# Patient Record
Sex: Female | Born: 1935 | Race: White | Hispanic: No | Marital: Single | Smoking: Never smoker
Health system: Southern US, Community
[De-identification: ages and names within clinical notes are randomized; demographics above are authoritative.]

## PROBLEM LIST (undated history)

## (undated) DIAGNOSIS — M1990 Unspecified osteoarthritis, unspecified site: Secondary | ICD-10-CM

## (undated) DIAGNOSIS — E7800 Pure hypercholesterolemia, unspecified: Secondary | ICD-10-CM

## (undated) HISTORY — PX: TOTAL KNEE ARTHROPLASTY: SHX125

## (undated) HISTORY — PX: TOTAL HIP ARTHROPLASTY: SHX124

---

## 1998-01-24 ENCOUNTER — Other Ambulatory Visit: Admission: RE | Admit: 1998-01-24 | Discharge: 1998-01-24 | Payer: Self-pay | Admitting: Obstetrics and Gynecology

## 2000-03-08 ENCOUNTER — Emergency Department (HOSPITAL_COMMUNITY): Admission: EM | Admit: 2000-03-08 | Discharge: 2000-03-08 | Payer: Self-pay | Admitting: Emergency Medicine

## 2001-03-17 ENCOUNTER — Inpatient Hospital Stay (HOSPITAL_COMMUNITY): Admission: AD | Admit: 2001-03-17 | Discharge: 2001-03-21 | Payer: Self-pay | Admitting: Cardiology

## 2001-03-17 ENCOUNTER — Encounter: Payer: Self-pay | Admitting: Cardiology

## 2001-09-04 ENCOUNTER — Encounter: Admission: RE | Admit: 2001-09-04 | Discharge: 2001-09-04 | Payer: Self-pay | Admitting: Family Medicine

## 2001-09-04 ENCOUNTER — Encounter: Payer: Self-pay | Admitting: Family Medicine

## 2002-04-25 ENCOUNTER — Emergency Department (HOSPITAL_COMMUNITY): Admission: EM | Admit: 2002-04-25 | Discharge: 2002-04-25 | Payer: Self-pay

## 2002-10-12 ENCOUNTER — Encounter: Admission: RE | Admit: 2002-10-12 | Discharge: 2002-10-12 | Payer: Self-pay | Admitting: Family Medicine

## 2002-10-12 ENCOUNTER — Encounter: Payer: Self-pay | Admitting: Family Medicine

## 2003-01-21 ENCOUNTER — Encounter: Payer: Self-pay | Admitting: Family Medicine

## 2003-01-21 ENCOUNTER — Encounter: Admission: RE | Admit: 2003-01-21 | Discharge: 2003-01-21 | Payer: Self-pay | Admitting: Family Medicine

## 2003-02-05 ENCOUNTER — Encounter: Admission: RE | Admit: 2003-02-05 | Discharge: 2003-05-06 | Payer: Self-pay | Admitting: Orthopedic Surgery

## 2003-04-16 ENCOUNTER — Encounter: Payer: Self-pay | Admitting: Family Medicine

## 2003-04-16 ENCOUNTER — Encounter: Admission: RE | Admit: 2003-04-16 | Discharge: 2003-04-16 | Payer: Self-pay | Admitting: Family Medicine

## 2005-06-15 ENCOUNTER — Inpatient Hospital Stay (HOSPITAL_COMMUNITY): Admission: EM | Admit: 2005-06-15 | Discharge: 2005-06-17 | Payer: Self-pay | Admitting: Emergency Medicine

## 2005-06-18 ENCOUNTER — Emergency Department (HOSPITAL_COMMUNITY): Admission: EM | Admit: 2005-06-18 | Discharge: 2005-06-18 | Payer: Self-pay | Admitting: Emergency Medicine

## 2005-07-22 ENCOUNTER — Emergency Department (HOSPITAL_COMMUNITY): Admission: EM | Admit: 2005-07-22 | Discharge: 2005-07-22 | Payer: Self-pay | Admitting: Emergency Medicine

## 2005-09-05 ENCOUNTER — Emergency Department (HOSPITAL_COMMUNITY): Admission: EM | Admit: 2005-09-05 | Discharge: 2005-09-05 | Payer: Self-pay | Admitting: Emergency Medicine

## 2011-08-12 ENCOUNTER — Emergency Department (HOSPITAL_BASED_OUTPATIENT_CLINIC_OR_DEPARTMENT_OTHER): Payer: Medicare Other

## 2011-08-12 ENCOUNTER — Emergency Department (HOSPITAL_BASED_OUTPATIENT_CLINIC_OR_DEPARTMENT_OTHER)
Admission: EM | Admit: 2011-08-12 | Discharge: 2011-08-13 | Disposition: A | Discharge status: Other Acute Inpatient Hospital | Payer: Medicare Other | Source: Home / Self Care | Attending: Emergency Medicine | Admitting: Emergency Medicine

## 2011-08-12 ENCOUNTER — Other Ambulatory Visit: Payer: Self-pay

## 2011-08-12 ENCOUNTER — Encounter: Payer: Self-pay | Admitting: *Deleted

## 2011-08-12 ENCOUNTER — Emergency Department (INDEPENDENT_AMBULATORY_CARE_PROVIDER_SITE_OTHER): Payer: Medicare Other

## 2011-08-12 DIAGNOSIS — I10 Essential (primary) hypertension: Secondary | ICD-10-CM | POA: Insufficient documentation

## 2011-08-12 DIAGNOSIS — W19XXXA Unspecified fall, initial encounter: Secondary | ICD-10-CM

## 2011-08-12 DIAGNOSIS — M25569 Pain in unspecified knee: Secondary | ICD-10-CM

## 2011-08-12 DIAGNOSIS — F430 Acute stress reaction: Secondary | ICD-10-CM | POA: Insufficient documentation

## 2011-08-12 DIAGNOSIS — E7800 Pure hypercholesterolemia, unspecified: Secondary | ICD-10-CM | POA: Insufficient documentation

## 2011-08-12 DIAGNOSIS — E119 Type 2 diabetes mellitus without complications: Secondary | ICD-10-CM | POA: Insufficient documentation

## 2011-08-12 DIAGNOSIS — K5900 Constipation, unspecified: Secondary | ICD-10-CM | POA: Insufficient documentation

## 2011-08-12 DIAGNOSIS — Z8739 Personal history of other diseases of the musculoskeletal system and connective tissue: Secondary | ICD-10-CM | POA: Insufficient documentation

## 2011-08-12 DIAGNOSIS — W1789XA Other fall from one level to another, initial encounter: Secondary | ICD-10-CM | POA: Insufficient documentation

## 2011-08-12 DIAGNOSIS — Y929 Unspecified place or not applicable: Secondary | ICD-10-CM | POA: Insufficient documentation

## 2011-08-12 DIAGNOSIS — M79609 Pain in unspecified limb: Secondary | ICD-10-CM

## 2011-08-12 DIAGNOSIS — Z79899 Other long term (current) drug therapy: Secondary | ICD-10-CM | POA: Insufficient documentation

## 2011-08-12 DIAGNOSIS — N289 Disorder of kidney and ureter, unspecified: Secondary | ICD-10-CM | POA: Insufficient documentation

## 2011-08-12 DIAGNOSIS — S60229A Contusion of unspecified hand, initial encounter: Secondary | ICD-10-CM | POA: Insufficient documentation

## 2011-08-12 DIAGNOSIS — Y92009 Unspecified place in unspecified non-institutional (private) residence as the place of occurrence of the external cause: Secondary | ICD-10-CM | POA: Insufficient documentation

## 2011-08-12 DIAGNOSIS — M25559 Pain in unspecified hip: Secondary | ICD-10-CM

## 2011-08-12 HISTORY — DX: Pure hypercholesterolemia, unspecified: E78.00

## 2011-08-12 HISTORY — DX: Unspecified osteoarthritis, unspecified site: M19.90

## 2011-08-12 LAB — DIFFERENTIAL
Basophils Relative: 0 % (ref 0–1)
Eosinophils Absolute: 0.1 10*3/uL (ref 0.0–0.7)
Lymphocytes Relative: 17 % (ref 12–46)
Lymphs Abs: 1.3 10*3/uL (ref 0.7–4.0)
Monocytes Absolute: 0.6 10*3/uL (ref 0.1–1.0)
Neutro Abs: 5.7 10*3/uL (ref 1.7–7.7)
Neutrophils Relative %: 75 % (ref 43–77)

## 2011-08-12 LAB — BASIC METABOLIC PANEL
BUN: 37 mg/dL — ABNORMAL HIGH (ref 6–23)
CO2: 27 mEq/L (ref 19–32)
GFR calc Af Amer: 48 mL/min — ABNORMAL LOW (ref >90)
GFR calc non Af Amer: 41 mL/min — ABNORMAL LOW (ref >90)
Glucose, Bld: 163 mg/dL — ABNORMAL HIGH (ref 70–99)
Potassium: 5 mEq/L (ref 3.5–5.1)

## 2011-08-12 LAB — CBC
MCV: 95.3 fL (ref 78.0–100.0)
Platelets: 126 10*3/uL — ABNORMAL LOW (ref 150–400)
RDW: 13.8 % (ref 11.5–15.5)

## 2011-08-12 MED ORDER — MORPHINE SULFATE 4 MG/ML IJ SOLN
4.0000 mg | Freq: Once | INTRAMUSCULAR | Status: AC
  Administered 2011-08-12: 4 mg via INTRAMUSCULAR
  Filled 2011-08-12: qty 1

## 2011-08-12 MED ORDER — HYDROCODONE-ACETAMINOPHEN 5-500 MG PO TABS: 1.0000 | ORAL_TABLET | Freq: Four times a day (QID) | ORAL | Status: AC | PRN

## 2011-08-12 MED ORDER — MORPHINE SULFATE 4 MG/ML IJ SOLN
4.0000 mg | Freq: Once | INTRAMUSCULAR | Status: AC
  Administered 2011-08-12: 4 mg via INTRAVENOUS
  Filled 2011-08-12: qty 1

## 2011-08-12 NOTE — ED Provider Notes (Signed)
Results for orders placed during the hospital encounter of 08/12/11  CBC      Component Value Range   WBC 7.6  4.0 - 10.5 (K/uL)   RBC 4.28  3.87 - 5.11 (MIL/uL)   Hemoglobin 13.1  12.0 - 15.0 (g/dL)   HCT 21.3  08.6 - 57.8 (%)   MCV 95.3  78.0 - 100.0 (fL)   MCH 30.6  26.0 - 34.0 (pg)   MCHC 32.1  30.0 - 36.0 (g/dL)   RDW 46.9  62.9 - 52.8 (%)   Platelets 126 (*) 150 - 400 (K/uL)  DIFFERENTIAL      Component Value Range   Neutrophils Relative 75  43 - 77 (%)   Neutro Abs 5.7  1.7 - 7.7 (K/uL)   Lymphocytes Relative 17  12 - 46 (%)   Lymphs Abs 1.3  0.7 - 4.0 (K/uL)   Monocytes Relative 7  3 - 12 (%)   Monocytes Absolute 0.6  0.1 - 1.0 (K/uL)   Eosinophils Relative 1  0 - 5 (%)   Eosinophils Absolute 0.1  0.0 - 0.7 (K/uL)   Basophils Relative 0  0 - 1 (%)   Basophils Absolute 0.0  0.0 - 0.1 (K/uL)  BASIC METABOLIC PANEL      Component Value Range   Sodium 141  135 - 145 (mEq/L)   Potassium 5.0  3.5 - 5.1 (mEq/L)   Chloride 107  96 - 112 (mEq/L)   CO2 27  19 - 32 (mEq/L)   Glucose, Bld 163 (*) 70 - 99 (mg/dL)   BUN 37 (*) 6 - 23 (mg/dL)   Creatinine, Ser 4.13 (*) 0.50 - 1.10 (mg/dL)   Calcium 9.5  8.4 - 24.4 (mg/dL)   GFR calc non Af Amer 41 (*) >90 (mL/min)   GFR calc Af Amer 48 (*) >90 (mL/min)   Patient was reevaluated and was unable to ambulate. This occurred even after 2 doses of morphine. Patient and family and I spoke extensively about plan of care. The family preferred the patient be transferred tone even though her primary care doctor normally would admit to [facility] regional. I spoke with the accepting physician at [facility]. We discussed that patient has stated that she would willing we go to nursing facility placement for her inability to ambulate and her left knee pain. No acute fracture or other cause for this other than discomfort was found today. Patient had a mild thrombocytopenia on her labs with platelets of 126. Otherwise there were no remarkable findings.  Patient will be transferred to [facility] for further care. She'll be admitted the triad hospitalist team 1.  Cyndra Numbers, MD 08/12/11 2222

## 2011-08-12 NOTE — ED Notes (Signed)
Pt c/o left sided CP for few minutes, no SOB or diaphoresis noted. EKG ordered.

## 2011-08-12 NOTE — ED Notes (Signed)
Pt unable to bear weight on legs with 2 person assist. Pt unable to ambulate, only able to pivot to move from bed to [facility].

## 2011-08-12 NOTE — ED Notes (Signed)
Patient unable to bear weight on the left leg.  Patient required 2+ assistance to pivot to the [facility].

## 2011-08-12 NOTE — ED Notes (Signed)
Pt says she was going down a ramp and the ramp collapsed and she fell. She c/o pain in left knee and left upper leg. Voices a previous knee replacement in the same knee

## 2011-08-12 NOTE — ED Provider Notes (Signed)
History     CSN: [id number] Arrival date & time: 08/12/2011  5:50 PM  Chief Complaint  Patient presents with  . Fall     HPI  Presents after fall. Patient states she normally he relates a cane. She states that she is having some work done to her brain at home. She was walking up the right with her relative when the entire ramp collapsed. She fell to her left side. She was unable to ambulate after the fall. She denies any head trauma, no loss of consciousness. She denies neck pain, back pain. She complains of pain in her diffuse left leg. She denies any hip pain but she is complaining of distal femur, left knee, left tib-fib pain. She denies numbness tingling weakness of extremities. She has a hematoma of her right hand. States that she takes an aspirin daily. History of left knee arthroplasty.   Past Medical History  Diagnosis Date  . High cholesterol   . Diabetes mellitus   . Arthritis     Past Surgical History  Procedure Date  . Total hip arthroplasty   . Total knee arthroplasty     No family history on file.  History  Substance Use Topics  . Smoking status: Not on file  . Smokeless tobacco: Not on file  . Alcohol Use: No    OB History    Grav Para Term Preterm Abortions TAB SAB Ect Mult Living                  Review of Systems Negative except as noted in history of present illness  Allergies  Penicillins  Home Medications   Current Outpatient Rx  Name Route Sig Dispense Refill  . ASPIRIN 325 MG PO TBEC Oral Take 325 mg by mouth daily.      Marland Kitchen VITAMIN B-12 PO Oral Take 1 tablet by mouth daily.      Marland Kitchen GABAPENTIN 300 MG PO CAPS Oral Take 300 mg by mouth daily.      Marland Kitchen PANTOPRAZOLE SODIUM 40 MG PO TBEC Oral Take 40 mg by mouth daily.      Marland Kitchen SIMVASTATIN 40 MG PO TABS Oral Take 40 mg by mouth daily.      Marland Kitchen SIMVASTATIN PO Oral Take by mouth.      . DIOVAN PO Oral Take by mouth.      . VALSARTAN 320 MG PO TABS Oral Take 320 mg by mouth daily.      Marland Kitchen VITAMIN D  (ERGOCALCIFEROL) PO Oral Take 1 tablet by mouth daily.        BP 146/71  Pulse 91  Temp(Src) 98.9 F (37.2 C) (Oral)  Resp 16  SpO2 96%  Physical Exam  Nursing note and vitals reviewed. Constitutional: She is oriented to person, place, and time. She appears well-developed.  HENT:  Head: Atraumatic.  Mouth/Throat: Oropharynx is clear and moist.  Eyes: Conjunctivae and EOM are normal. Pupils are equal, round, and reactive to light.  Neck: Normal range of motion. Neck supple.       No cervical thoracic or lumbar midline tenderness to palpation  Cardiovascular: Normal rate, regular rhythm, normal heart sounds and intact distal pulses.   Pulmonary/Chest: Effort normal and breath sounds normal. No respiratory distress. She has no wheezes. She has no rales.  Abdominal: Soft. She exhibits no distension. There is no tenderness. There is no rebound and no guarding.  Musculoskeletal: Normal range of motion.       She has diffuse  pain in her left knee. There is some medial ecchymosis and tenderness to palpation. She has distal femur tenderness to palpation. His left proximal tib-fib tenderness to palpation. Her distal pulses are intact. Gross sensation is intact  Right hand with hematoma phalanx of right thumb palmar aspect. There is mild tenderness to palpation. She does not have ecchymosis, deformity or tenderness to palpation of her right wrist.  Pelvis is stable but unable to fully evaluate left hip secondary to pain in her knee.  Neurological: She is alert and oriented to person, place, and time.  Skin: Skin is warm and dry. No rash noted.  Psychiatric: She has a normal mood and affect.    ED Course  Procedures (including critical care time)  Labs Reviewed - No data to display No results found.   No diagnosis found.    MDM  Status post mechanical fall with multiple complaints. We'll x-ray right hand, pelvis, left femur, left hip, left tib-fib. Pain control reassess.  Stefano Gaul, MD   7:43 PM  XR reviewed and negative. Pt requiring 2+ assist with standing 2/2 knee pain.  CT would likely not be helpful given her arthroplasty causing image scattering. Will order morphine. Reassess for possible admit. D/W Pt and family. Signed out to Dr. Alto Denver.  Stefano Gaul, MD       Forbes Cellar, MD 08/12/11 1944

## 2011-08-13 ENCOUNTER — Inpatient Hospital Stay (HOSPITAL_COMMUNITY): Payer: Medicare Other

## 2011-08-13 ENCOUNTER — Observation Stay (HOSPITAL_COMMUNITY)
Admission: AD | Admit: 2011-08-13 | Discharge: 2011-08-17 | Disposition: A | Discharge status: Home / Self Care | Payer: Medicare Other | Source: Other Acute Inpatient Hospital | Attending: Internal Medicine | Admitting: Internal Medicine

## 2011-08-13 LAB — BASIC METABOLIC PANEL
BUN: 31 mg/dL — ABNORMAL HIGH (ref 6–23)
CO2: 26 mEq/L (ref 19–32)
Chloride: 105 mEq/L (ref 96–112)
GFR calc non Af Amer: 44 mL/min — ABNORMAL LOW (ref >90)

## 2011-08-13 LAB — VITAMIN D 25 HYDROXY (VIT D DEFICIENCY, FRACTURES): Vit D, 25-Hydroxy: 36 ng/mL (ref 30–89)

## 2011-08-13 LAB — GLUCOSE, CAPILLARY
Glucose-Capillary: 135 mg/dL — ABNORMAL HIGH (ref 70–99)
Glucose-Capillary: 137 mg/dL — ABNORMAL HIGH (ref 70–99)
Glucose-Capillary: 129 mg/dL — ABNORMAL HIGH (ref 70–99)

## 2011-08-13 NOTE — ED Notes (Signed)
Pt report given to Maralyn Sago, RN with [facility]

## 2011-08-13 NOTE — ED Notes (Signed)
Pt pivoted to [facility] with 2 person assist, voided large amount of clear yellow urine. Pt report given to [facility] and pt placed in there care for transport.

## 2011-08-13 NOTE — ED Notes (Signed)
Pt report given to Inetta Fermo, RN on unit 5000 at [facility]

## 2011-08-14 LAB — GLUCOSE, CAPILLARY
Glucose-Capillary: 177 mg/dL — ABNORMAL HIGH (ref 70–99)
Glucose-Capillary: 129 mg/dL — ABNORMAL HIGH (ref 70–99)

## 2011-08-14 LAB — CBC
Hemoglobin: 11.7 g/dL — ABNORMAL LOW (ref 12.0–15.0)
MCH: 30.7 pg (ref 26.0–34.0)
MCV: 96.9 fL (ref 78.0–100.0)
Platelets: 104 10*3/uL — ABNORMAL LOW (ref 150–400)
RBC: 3.81 MIL/uL — ABNORMAL LOW (ref 3.87–5.11)
WBC: 5.2 10*3/uL (ref 4.0–10.5)

## 2011-08-14 LAB — BASIC METABOLIC PANEL
CO2: 26 mEq/L (ref 19–32)
Calcium: 9.1 mg/dL (ref 8.4–10.5)
Chloride: 107 mEq/L (ref 96–112)
Creatinine, Ser: 1.06 mg/dL (ref 0.50–1.10)
Glucose, Bld: 122 mg/dL — ABNORMAL HIGH (ref 70–99)
Sodium: 143 mEq/L (ref 135–145)

## 2011-08-15 LAB — GLUCOSE, CAPILLARY: Glucose-Capillary: 145 mg/dL — ABNORMAL HIGH (ref 70–99)

## 2011-08-16 DIAGNOSIS — R5381 Other malaise: Secondary | ICD-10-CM

## 2011-08-16 DIAGNOSIS — S8000XA Contusion of unspecified knee, initial encounter: Secondary | ICD-10-CM

## 2011-08-16 LAB — GLUCOSE, CAPILLARY
Glucose-Capillary: 147 mg/dL — ABNORMAL HIGH (ref 70–99)
Glucose-Capillary: 128 mg/dL — ABNORMAL HIGH (ref 70–99)
Glucose-Capillary: 119 mg/dL — ABNORMAL HIGH (ref 70–99)

## 2011-08-17 LAB — GLUCOSE, CAPILLARY: Glucose-Capillary: 114 mg/dL — ABNORMAL HIGH (ref 70–99)

## 2011-08-17 NOTE — Consult Note (Signed)
[name], [name] NO.:  [id number]  MEDICAL RECORD NO.:  [id number]  LOCATION:  5010                         FACILITY:  [facility]  PHYSICIAN:  Eulas Post, MD    DATE OF BIRTH:  [birth year]  DATE OF CONSULTATION:  08/13/2011 DATE OF DISCHARGE:                                CONSULTATION   REQUESTING SERVICE:  [facility] Team.  CHIEF COMPLAINT:  Left knee pain.  HISTORY:  Ms. [name] is an 77 year old woman who had a fall on her porch and landed directly onto her left knee causing her knee go into a deep hyperflexed position, acutely.  She had severe pain and was unable to walk.  She was brought into the emergency room and admitted to the Medical Service.  X-rays were taken.  She complains of severe pain with both active and passive motion around her left knee, and she is unable to bear weight.  This occurred was within the last day or so. She denies any loss of consciousness and had no other injuries in the fall.  The pain is located directly around the left knee, somewhat diffusely.  PAST MEDICAL HISTORY:  She has had a history of DVT as well as morbid obesity, atypical chest pain, osteoarthritis, bilateral total knee replacements, as well as a left-sided total hip replacement, diffuse coronary ectasia, hypertension, hypercholesterolemia.  She also has a history of diabetes.  FAMILY HISTORY:  Positive for DVT in her mother.  SOCIAL HISTORY:  She is a nonsmoker.  REVIEW OF SYSTEMS:  Complete review of systems was performed and was negative with the exception of the complaints in the musculoskeletal system as indicated above.  Please see the associated documentation for additional details of exam findings.  ADDITIONAL EXAMINATION:  Her left knee has a little bit of anterior bruise, but no significant hematoma or bruising.  Sensation is intact throughout the leg.  She has well-healed surgical scar over her left knee.  She has EHL  and FHL are firing.  She can actually lift the leg with assistance off of the bed, and it does not really cause too much in way of pain, but both active and passive motion does cause pain.  I was able to get her to 30 degrees of flexion, but this did cause substantial pain.  It is difficult to assess the integrity of her quadriceps.  There is diffuse pain to palpation directly.  X-rays of her left lower extremity demonstrate evidence for previous total hip arthroplasty, as well as total knee arthroplasty.  All the components appear to be in good position and I do not see evidence for loosening.  We do not have a great x-ray that I can see of the lateral of her knee, however, based on the ones that I do see I do not see evidence for patella alta or patella baja.  IMPRESSION:  Left leg pain, status post fall, status post total knee replacement.  PLAN:  The initial consultation over the phone indicated question meniscal tear.  This is not possible in the setting of total knee replacement.  I do not think that an MRI would be of any benefit to evaluating  her intra-articular components, although if there is concern regarding her integrity of her quadriceps tendon, then an MRI may be considered.  I think that her quadriceps intact clinically, and I am going to get her a knee brace and have her begin working with physical therapy.  She can be weightbearing as tolerated, and she will be on DVT prophylaxis given her history of DVT.  I will defer to the medical service's decision regarding this, but would assume it will probably be Lovenox.  This is an acute severe injury, given the hyperflexed injury this does not surprise me that she is having this much pain and difficulty with ambulation, particularly in light of her morbid obesity. Therefore, physical therapy will certainly be appropriate, and we will plan to make sure that the her extensor mechanism remains intact, and if there is any  additional concern for quadriceps tendon rupture, then MRI may be entertained.  In the meantime, we will see how she does clinically.  I would not recommend any type of injection or any other treatment to the knee with the exception of bracing and therapy.  This is an acute severe injury that is going to take at least a couple of weeks, if not a couple a month or 2 to recover from.  I do not think that she is done any damage to her components, and I do not see any evidence for periprosthetic fracture.     Eulas Post, MD     JPL/MEDQ  D:  08/13/2011  T:  08/13/2011  Job:  [id number]  Electronically Signed by Teryl Lucy MD on 08/17/2011 09:46:44 AM

## 2011-08-24 NOTE — Discharge Summary (Signed)
[name], [name]             ACCOUNT NO.:  [id number]  MEDICAL RECORD NO.:  [id number]  LOCATION:  5010                         FACILITY:  [facility]  PHYSICIAN:  Thad Ranger, MD       DATE OF BIRTH:  [birth year]  DATE OF ADMISSION:  08/13/2011 DATE OF DISCHARGE:  08/17/2011                        DISCHARGE SUMMARY - REFERRING   PRIMARY CARE PHYSICIAN:  Evelena Peat, M.D., [facility].  DISCHARGE DIAGNOSES: 1. Right knee pain, possibly secondary to right knee strain.  No     fracture. 2. Hypertension. 3. Gastroesophageal reflux disease. 4. Mechanical fall.  CONSULTATIONS:  Orthopedics Dr. Teryl Lucy.  DISCHARGE MEDICATIONS: 1. Robaxin 750 mg p.o. q.i.d. 2. Percocet 5/325 mg 1-2 tabs every 6 hours as needed for pain. 3. Senokot-S 2 tablets p.o. daily at bedtime as needed for     constipation. 4. Gabapentin 300 mg p.o. t.i.d. 5. Tylenol Extra Strength 500 mg p.o. t.i.d. for pain. 6. Aspirin 325 mg p.o. daily at bedtime. 7. Protonix 40 mg p.o. daily. 8. Simvastatin 40 mg daily. 9. Vitamin B12 one tablet p.o. daily. 10.Vitamin C one tablet daily. 11.Vitamin D one tablet daily.  BRIEF HISTORY OF PRESENT ILLNESS:  At the time of admission, Ms. [name] is an 77 year old female who presented after mechanical fall while she was walking on the ramp.  She fell onto her left side.  Fall was witnessed and was broken by her son-in-law who was accompanying down the stairs.  The patient had a hematoma on her right hand, but no other injury.  The patient was taken to the [facility] Emergency Room.  She could not ambulate well as she previously was able to, hence the patient was transferred to [facility] for further workup.  RADIOLOGICAL DATA:  Left tibia-fibula x-ray, osteopenia with postop changes, no fracture or acute abnormality.  Left femur x-ray October 11, postop changes, no fracture or dislocation.  Pelvic x-ray, October  11, degenerative and postop changes, no acute abnormality.  Right hand x-ray negative for fracture or acute abnormality, osteopenia, and degenerative changes.  Left knee x-ray 2 view showed left knee arthroplasty without evidence of hardware complication.  HOSPITAL COURSE:  Brief hospitalization course so far, Ms. [name] is an 77 year old female who was admitted after a mechanical fall and pain in her left knee. 1. Mechanical fall with left knee strain.  The patient was admitted to     the hospitalist service.  Orthopedics was consulted, and the     patient was graciously seen by Dr. Teryl Lucy.  The patient had     multiple radiological imaging during the hospitalization which were     negative for fracture or dislocation.  The patient was placed on     knee brace and immobilization and weight bearing as tolerated.  The     patient did not qualify for inpatient or outpatient rehab.  She has     progressed and improved with physical therapy, and will be     discharged home to continue home physical therapy and occupational     therapy.  The patient will follow up with Dr. Teryl Lucy within     next 1  to 2 weeks after the hospital discharge. 2. Pain control, the patient was started on Robaxin, Percocet, Tylenol     Extra Strength, and Neurontin which has somewhat controlled her     pain.  The patient does not have any pain in her knee upon rest,     however, has pain upon ambulating with physical therapy. 3. Hypertension.  During the hospitalization, the patient's blood     pressure was borderline, hence the outpatient Diovan was placed on     hold until she follows up with her primary care physician.  VITAL SIGNS AT THE TIME OF THE DISCHARGE:  VITAL SIGNS:  Temp 98.1, pulse 60, respirations 18, blood pressure 126/78, O2 sats 94% on room air. GENERAL:  The patient is alert, awake, and oriented, not in any acute distress. HEENT:  Anicteric sclerae and clear conjunctivae.   Pupils are reactive to light and accommodation.  EOMI. NECK:  Supple.  No lymphadenopathy.  No JVD. CVS:  S1 and S2 clear. CHEST:  Clear to auscultation bilaterally. ABDOMEN:  Soft, nontender, nondistended.  Normal bowel sounds. EXTREMITIES:  Left knee in immobilizer.  DISCHARGE FOLLOWUP:  Discharge followup with Dr. Teryl Lucy in 1 to 2 weeks of hospital followup.  Discharge followup with Dr. Evelena Peat in 2 weeks.  TIME SPENT:  35 minutes.     Thad Ranger, MD     RR/MEDQ  D:  08/17/2011  T:  08/17/2011  Job:  [id number]  cc:   Eulas Post, MD Evelena Peat, M.D.  Electronically Signed by Andres Labrum Kori Colin  on 08/24/2011 01:37:25 PM

## 2011-08-27 NOTE — H&P (Signed)
[name], [name]             ACCOUNT NO.:  [id number]  MEDICAL RECORD NO.:  [id number]  LOCATION:  5010                         FACILITY:  [facility]  PHYSICIAN:  Pleas Koch, MD        DATE OF BIRTH:  [birth year]  DATE OF ADMISSION:  08/13/2011 DATE OF DISCHARGE:                             HISTORY & PHYSICAL   CHIEF COMPLAINT:  Fall.  This is briefly a very pleasant 77 year old Caucasian female who was on her way to her sister-in-law's funeral said when  she was walking on the right side of a ramp, she fell onto her left side but states that the mechanism of fall was that she fell, the ramp just collapsed and her knee buckled from under her with the hamstrings flexed and she fell on to the point of her knee and hurt the medial side of the left knee.  The fall was witnessed and was broken by her son-in-law who is accompanying down the stairs.  She had a hematoma on her right hand, but has had no other real injury and was taken directly to the [facility] Emergency Room.  There, she was given numerous doses of pain medications but was complete total assist with regards to her pain and in view of the fact that she could not ambulate well as she previously was able to, hospitalist was asked to admit the patient.  PAST MEDICAL HISTORY:  Significant for atypical chest pain, remote history of DVT, severe osteoarthritis, diffuse coronary ectasia, hypertension, hypercholesterolemia, super morbid obesity, DVT off Coumadin.  The patient has had hip surgeries, knee surgeries, and multiple other osteoarthritis type surgeries in the past.  No known drug allergies.  MEDICATIONS:  Have not been reconciled at present time.  SOCIAL HISTORY:  The patient lives alone at home.  Has never been a smoker or drinker.  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  PHYSICAL EXAMINATION:  GENERAL:  The patient is a very pleasant Caucasian female in no  acute distress.  She is morbidly obese. VITAL SIGNS:  On admission, her vitals were T 99.3, pulse 73, respirations were 20, blood pressure 111/66, and O2 saturations 98% on room air. HEENT:  She has no icterus or pallor.  She has poor dentition. NECK:  I do not appreciate any JVD. HEART:  S1 and S2.  No murmurs, rubs, or gallops. LUNGS:  CTAB. ABDOMEN:  Morbidly obese, nontender, and nondistended. EXTREMITIES:  Left lower extremity; there does appear to be some diffuse effusion around the knee joint.  There is some remnant of scarring on the lateral aspect of the knee.  I am not able to do any specific maneuvers other than passively bend her knee and even this caused her severe pain.  She can plantar flex both ankles effectively. NEUROLOGIC:  She seems grossly intact.  PERTINENT LABORATORY TESTS AND WORKUP:  CBC showing WBC 7.6, hemoglobin 13.1, hematocrit 40.8, and platelet count of 126.  BMET was hemolyzed showed a potassium of 5.0, sodium 141, chloride 107, BUN and creatinine 37 and 1.20.  Her baseline creatinine in 2006 was 1.3.  Differential:  Neutrophils of  75%.  RADIOLOGY DONE:  X-ray of tib-fib September 12, 2011, showed osteopenia and postop change, no acute fracture or acute abnormality.  Subsequent femur x-ray of the left showed postop changes with no fracture/dislocation >  Subsequent  pelvis x-ray on September 12, 2011, showed postop. changes as above.  No acute abnormality.  X-ray of hand showed negative fracture, no acute bony abnormality, osteopenia and degenerative changes as above.  IMPRESSION/ASSESSMENT: 1. Fall with possible left knee meniscal tear.  Dr. Salvatore Marvel was     her orthopedist in the past who did her surgery.  We will consult     him.  In the morning, rounding physician will consult orthopedician     with regards to best plan of care for the same.  She may benefit     from an MRI of the knee if this is deemed appropriate by     Orthopedics.   Physical therapy and occupational therapy will be     involved in her care with regards to this and I will order their     assessment and plan. 2. Acute kidney injury.  The patient's baseline creatinine is about     1.2-1.3, but she does have some acute renal insufficiency     superimposed on this and we will place her on saline at 75 mL an     hour. 3. Obesity.  The patient will need Nutrition consult, which we will     order. 4. Osteopenia.  The patient will need to be on calcium carbonate and     we will get a vitamin D level. 5. Hypertension.  This is moderately controlled.  We will review the     patient daily. 6. Pain secondary to fall.  The patient will be kept on morphine. 7. Constipation.  The patient does complain of constipation.  She will     be kept on the correct bowel regimen.  The patient will be reviewed on day-to-day basis and the patient's disposition likely will be a nursing facility if she is not able to ambulate.  I spent over 35 minutes in time including admitting this patient.          ______________________________ Pleas Koch, MD     JS/MEDQ  D:  08/13/2011  T:  08/13/2011  Job:  [id number]  Electronically Signed by Pleas Koch MD on 08/27/2011 02:34:45 PM

## 2011-09-25 ENCOUNTER — Emergency Department (HOSPITAL_BASED_OUTPATIENT_CLINIC_OR_DEPARTMENT_OTHER)
Admission: EM | Admit: 2011-09-25 | Discharge: 2011-09-25 | Disposition: A | Discharge status: Home / Self Care | Payer: Medicare Other | Attending: Emergency Medicine | Admitting: Emergency Medicine

## 2011-09-25 ENCOUNTER — Emergency Department (INDEPENDENT_AMBULATORY_CARE_PROVIDER_SITE_OTHER): Payer: Medicare Other

## 2011-09-25 ENCOUNTER — Encounter (HOSPITAL_BASED_OUTPATIENT_CLINIC_OR_DEPARTMENT_OTHER): Payer: Self-pay | Admitting: *Deleted

## 2011-09-25 DIAGNOSIS — Z79899 Other long term (current) drug therapy: Secondary | ICD-10-CM | POA: Insufficient documentation

## 2011-09-25 DIAGNOSIS — E7800 Pure hypercholesterolemia, unspecified: Secondary | ICD-10-CM | POA: Insufficient documentation

## 2011-09-25 DIAGNOSIS — J209 Acute bronchitis, unspecified: Secondary | ICD-10-CM | POA: Insufficient documentation

## 2011-09-25 DIAGNOSIS — E119 Type 2 diabetes mellitus without complications: Secondary | ICD-10-CM | POA: Insufficient documentation

## 2011-09-25 DIAGNOSIS — Z8739 Personal history of other diseases of the musculoskeletal system and connective tissue: Secondary | ICD-10-CM | POA: Insufficient documentation

## 2011-09-25 DIAGNOSIS — R05 Cough: Secondary | ICD-10-CM

## 2011-09-25 DIAGNOSIS — R062 Wheezing: Secondary | ICD-10-CM

## 2011-09-25 DIAGNOSIS — R0602 Shortness of breath: Secondary | ICD-10-CM | POA: Insufficient documentation

## 2011-09-25 LAB — RAPID STREP SCREEN (MED CTR MEBANE ONLY): Streptococcus, Group A Screen (Direct): NEGATIVE

## 2011-09-25 MED ORDER — ALBUTEROL SULFATE HFA 108 (90 BASE) MCG/ACT IN AERS
2.0000 | INHALATION_SPRAY | RESPIRATORY_TRACT | Status: DC | PRN
  Administered 2011-09-25: 2 via RESPIRATORY_TRACT
  Filled 2011-09-25: qty 6.7

## 2011-09-25 MED ORDER — ALBUTEROL SULFATE HFA 108 (90 BASE) MCG/ACT IN AERS: 2.0000 | INHALATION_SPRAY | RESPIRATORY_TRACT | Status: DC | PRN

## 2011-09-25 MED ORDER — AZITHROMYCIN 250 MG PO TABS: 250.0000 mg | ORAL_TABLET | Freq: Every day | ORAL | Status: AC

## 2011-09-25 NOTE — ED Notes (Signed)
Patient states that Thanksgiving she started experiencing sob & sore throat, night chills,  Which has grown worse, productive cough

## 2011-09-25 NOTE — ED Provider Notes (Signed)
History     CSN: [id number] Arrival date & time: 09/25/2011  9:10 AM   First MD Initiated Contact with Patient 09/25/11 0930      Chief Complaint  Patient presents with  . Shortness of Breath    (Consider location/radiation/quality/duration/timing/severity/associated sxs/prior treatment) HPI Comments: The patient is an 77 year old female who presents with 2-3 days of shortness of breath, productive cough, sore throat, nasal congestion, chills and is found to have end exhalatory wheezing on auscultation of her chest. She is in no apparent respiratory distress. She does have a hoarse voice.  Patient is a 77 y.o. female presenting with shortness of breath. The history is provided by the patient and a relative.  Shortness of Breath  The current episode started 2 days ago. The onset was gradual. The problem occurs frequently. The problem has been gradually worsening. The problem is mild. The symptoms are relieved by nothing. The symptoms are aggravated by nothing. Associated symptoms include sore throat, cough, shortness of breath and wheezing. Pertinent negatives include no chest pain, no chest pressure, no orthopnea, no fever, no rhinorrhea and no stridor. The cough has no precipitants. The cough is productive. Color change with cough: Yellow. Nothing relieves the cough. Nothing worsens the cough. She has been experiencing a moderate sore throat. Neither side is more painful than the other. The sore throat is characterized by pain only. There was no intake of a foreign body. She has not inhaled smoke recently. She has had no prior steroid use. Her past medical history does not include asthma, bronchiolitis, past wheezing, eczema or asthma in the family. She has been behaving normally. Urine output has been normal. There were no sick contacts. She has received no recent medical care.    Past Medical History  Diagnosis Date  . High cholesterol   . Diabetes mellitus   . Arthritis     Past  Surgical History  Procedure Date  . Total hip arthroplasty   . Total knee arthroplasty     No family history on file.  History  Substance Use Topics  . Smoking status: Not on file  . Smokeless tobacco: Not on file  . Alcohol Use: No    OB History    Grav Para Term Preterm Abortions TAB SAB Ect Mult Living                  Review of Systems  Constitutional: Positive for fatigue. Negative for fever, chills, diaphoresis, activity change, appetite change and unexpected weight change.  HENT: Positive for congestion, sore throat and postnasal drip. Negative for hearing loss, ear pain, nosebleeds, facial swelling, rhinorrhea, sneezing, drooling, mouth sores, trouble swallowing, neck pain, neck stiffness, dental problem, voice change, sinus pressure, tinnitus and ear discharge.   Eyes: Negative for photophobia, pain, discharge, redness and itching.  Respiratory: Positive for cough, shortness of breath and wheezing. Negative for choking, chest tightness and stridor.   Cardiovascular: Negative for chest pain, palpitations, orthopnea and leg swelling.  Gastrointestinal: Negative for nausea, vomiting, abdominal pain, diarrhea, constipation, blood in stool, abdominal distention and anal bleeding.  Genitourinary: Negative for dysuria, urgency, frequency, hematuria, flank pain and difficulty urinating.  Musculoskeletal: Positive for myalgias. Negative for back pain, joint swelling, arthralgias and gait problem.  Skin: Negative for color change, pallor, rash and wound.  Neurological: Negative for dizziness, weakness, light-headedness and headaches.  Hematological: Positive for adenopathy. Does not bruise/bleed easily.  Psychiatric/Behavioral: Negative.     Allergies  Penicillins  Home Medications  Current Outpatient Rx  Name Route Sig Dispense Refill  . ASPIRIN 325 MG PO TBEC Oral Take 325 mg by mouth daily.      Marland Kitchen VITAMIN B-12 PO Oral Take 1 tablet by mouth daily.      Marland Kitchen GABAPENTIN 300  MG PO CAPS Oral Take 300 mg by mouth daily.      Marland Kitchen PANTOPRAZOLE SODIUM 40 MG PO TBEC Oral Take 40 mg by mouth daily.      Marland Kitchen SIMVASTATIN 40 MG PO TABS Oral Take 40 mg by mouth daily.      Marland Kitchen SIMVASTATIN PO Oral Take by mouth.      . DIOVAN PO Oral Take by mouth.      . VALSARTAN 320 MG PO TABS Oral Take 320 mg by mouth daily.      Marland Kitchen VITAMIN D (ERGOCALCIFEROL) PO Oral Take 1 tablet by mouth daily.        BP 175/79  Pulse 68  Temp(Src) 98.3 F (36.8 C) (Oral)  Resp 21  SpO2 97%  Physical Exam  Nursing note and vitals reviewed. Constitutional: She is oriented to person, place, and time. She appears well-developed and well-nourished. No distress.  HENT:  Head: Normocephalic and atraumatic.  Right Ear: External ear normal.  Left Ear: External ear normal.  Nose: Nose normal.  Mouth/Throat: Oropharynx is clear and moist.  Eyes: Conjunctivae and EOM are normal. Pupils are equal, round, and reactive to light.  Neck: Normal range of motion. Neck supple. No JVD present. No tracheal deviation present.  Cardiovascular: Normal rate, regular rhythm, normal heart sounds and intact distal pulses.  Exam reveals no gallop and no friction rub.   No murmur heard. Pulmonary/Chest: Effort normal. No accessory muscle usage or stridor. Not tachypneic. No respiratory distress. She has no decreased breath sounds. She has wheezes in the right upper field, the right middle field, the left upper field and the left middle field. She has no rhonchi. She has no rales. She exhibits no tenderness.  Abdominal: Soft. Bowel sounds are normal. She exhibits no distension. There is no tenderness. There is no rebound and no guarding.  Musculoskeletal: Normal range of motion. She exhibits no edema and no tenderness.  Lymphadenopathy:    She has no cervical adenopathy.  Neurological: She is alert and oriented to person, place, and time. She has normal reflexes. No cranial nerve deficit. She exhibits normal muscle tone.  Coordination normal.  Skin: Skin is warm and dry. No rash noted. She is not diaphoretic. No erythema. No pallor.  Psychiatric: She has a normal mood and affect. Her behavior is normal. Judgment and thought content normal.    ED Course  Procedures (including critical care time)   Labs Reviewed  POCT RAPID STREP A   No results found.   No diagnosis found.    MDM  With productive cough and wheezing in a patient without reactive airway disease, especially with her being elderly and diabetic, I am interested in seeing a chest x-ray to evaluate for pneumonia. Otherwise I think that this represents an acute bronchitis. Her pharynx is mildly erythematous but not frankly apparent for strep throat. I will send a throat culture to evaluate this further. I will treat her wheezing with an albuterol inhaler. As she is diabetic I will not initially treat her with steroids as she is in no apparent respiratory distress.        Felisa Bonier, MD 09/25/11 [phone]

## 2012-08-10 ENCOUNTER — Other Ambulatory Visit: Payer: Self-pay | Admitting: Family Medicine

## 2012-08-10 DIAGNOSIS — IMO0002 Reserved for concepts with insufficient information to code with codable children: Secondary | ICD-10-CM

## 2012-08-21 ENCOUNTER — Ambulatory Visit
Admission: RE | Admit: 2012-08-21 | Discharge: 2012-08-21 | Disposition: A | Discharge status: Home / Self Care | Payer: Medicare Other | Source: Ambulatory Visit | Attending: Family Medicine | Admitting: Family Medicine

## 2012-08-21 DIAGNOSIS — IMO0002 Reserved for concepts with insufficient information to code with codable children: Secondary | ICD-10-CM

## 2014-02-28 ENCOUNTER — Encounter: Payer: Self-pay | Admitting: Cardiology

## 2014-02-28 ENCOUNTER — Ambulatory Visit (INDEPENDENT_AMBULATORY_CARE_PROVIDER_SITE_OTHER): Payer: Medicare Other | Admitting: Cardiology

## 2014-02-28 ENCOUNTER — Encounter (INDEPENDENT_AMBULATORY_CARE_PROVIDER_SITE_OTHER): Payer: Self-pay

## 2014-02-28 VITALS — BP 128/74 | HR 72 | Ht 69.0 in | Wt 326.0 lb

## 2014-02-28 DIAGNOSIS — E119 Type 2 diabetes mellitus without complications: Secondary | ICD-10-CM

## 2014-02-28 DIAGNOSIS — I10 Essential (primary) hypertension: Secondary | ICD-10-CM

## 2014-02-28 DIAGNOSIS — R0602 Shortness of breath: Secondary | ICD-10-CM

## 2014-02-28 NOTE — Patient Instructions (Signed)
Your physician recommends that you continue on your current medications as directed. Please refer to the Current Medication list given to you today.  Your physician has requested that you have an echocardiogram. Echocardiography is a painless test that uses sound waves to create images of your heart. It provides your doctor with information about the size and shape of your heart and how well your heart's chambers and valves are working. This procedure takes approximately one hour. There are no restrictions for this procedure.  Your physician recommends that you schedule a follow-up as needed. 

## 2014-02-28 NOTE — Progress Notes (Signed)
[address] [place], [place]  [address] Phone: [phone] Fax:  [phone]  Date:  02/28/2014   ID:  [name], DOB December 28, 1926, MRN [id number]  PCP:  No primary provider on file.   History of Present Illness: [name] is a 80 y.o. female here for the evaluation of shortness of breath. Prior office notes reviewed from 02/06/2014 at her primary physician's office and she was evaluated for bilateral lower extremity edema, body aches, fatigue and generalized weakness that was moderate in severity occurring approximately 6 months ago and worsening. She has no history of heart failure. In 2006 she had an echocardiogram that showed moderate LVH with impaired relaxation. DVT was negative at that time. Morbid obesity. Diabetes hemoglobin A1c 7.5. He was encouraged upon to wear compression stockings. An echocardiogram was requested. Hemoglobin is 14.6, platelets 129, hemoglobin A1c 7.5, sodium 138, potassium 5.2, creatinine 1.2, LDL 99, vitamin B12 normal.  He's been feeling shortness of breath at times with activity she has been attributing to old age, weight. She has had knee replacements, hip replacements. Painful for her to walk. She has a walker with chair. She denies any syncope, fevers, cough.   Wt Readings from Last 3 Encounters:  02/28/14 326 lb (147.873 kg)     Past Medical History  Diagnosis Date  . High cholesterol   . Diabetes mellitus   . Arthritis     Past Surgical History  Procedure Laterality Date  . Total hip arthroplasty    . Total knee arthroplasty      Current Outpatient Prescriptions  Medication Sig Dispense Refill  . aspirin 325 MG EC tablet Take 325 mg by mouth daily.        . Cyanocobalamin (VITAMIN B-12 PO) Take 1 tablet by mouth daily.        Marland Kitchen. gabapentin (NEURONTIN) 300 MG capsule Take 300 mg by mouth daily.        . pantoprazole (PROTONIX) 40 MG tablet Take 40 mg by mouth daily.        . simvastatin (ZOCOR) 40 MG  tablet Take 40 mg by mouth daily.        . valsartan (DIOVAN) 320 MG tablet Take 320 mg by mouth daily.        Marland Kitchen. VITAMIN D, ERGOCALCIFEROL, PO Take 1 tablet by mouth daily.        Marland Kitchen. albuterol (PROVENTIL HFA;VENTOLIN HFA) 108 (90 BASE) MCG/ACT inhaler Inhale 2 puffs into the lungs every 4 (four) hours as needed for wheezing or shortness of breath (cough).  1 Inhaler  0   No current facility-administered medications for this visit.    Allergies:    Allergies  Allergen Reactions  . Penicillins Swelling    Social History:  The patient  reports that she does not drink alcohol or use illicit drugs.   No family history on file.  ROS:  Please see the history of present illness.   Denies any fevers, chills, syncope, bleeding, strokelike symptoms, orthopnea.   All other systems reviewed and negative.   PHYSICAL EXAM: VS:  BP 128/74  Pulse 72  Ht 5\' 9"  (1.753 m)  Wt 326 lb (147.873 kg)  BMI 48.12 kg/m2 Well nourished, well developed, in no acute distress HEENT: normal, [place]/AT, EOMI Neck: no JVD, normal carotid upstroke, no bruit Cardiac:  normal S1, S2; RRR; no murmur Lungs:  clear to auscultation bilaterally, no wheezing, rhonchi or rales Abd: soft, nontender, no  hepatomegaly, no bruits Ext: no edema, 2+ distal pulses Skin: warm and dry GU: deferred Neuro: no focal abnormalities noted, AAO x 3  EKG:  Sinus rhythm, first degree AV block, heart rate 72, inferior infarct pattern, poor R wave progression.     Prior medical records, lab work, studies have been reviewed.  ASSESSMENT AND PLAN:  1. Dyspnea-check echocardiogram. It is been 9 years since last evaluation and at that time echocardiogram demonstrated normal EF with diastolic dysfunction. Echocardiogram results may help guide therapy. In general, I would be careful with diuretics but she may benefit from low dose Lasix 20 mg. Continue with current angiotensin receptor blocker.  2. Morbid obesity-continue to encourage weight loss.  This is limiting mobility. 3. Hypertension-currently well controlled. Medications reviewed. 4. Diabetes-per primary physician. Hemoglobin A1c reviewed as above  Signed, Donato SchultzMark Mckenzey Parcell, MD [facility]  02/28/2014 2:51 PM

## 2014-03-19 ENCOUNTER — Ambulatory Visit (HOSPITAL_COMMUNITY): Payer: Medicare Other | Attending: Cardiovascular Disease | Admitting: Radiology

## 2014-03-19 DIAGNOSIS — R0602 Shortness of breath: Secondary | ICD-10-CM

## 2014-03-19 NOTE — Progress Notes (Signed)
Echocardiogram performed.  

## 2014-04-22 ENCOUNTER — Telehealth: Payer: Self-pay | Admitting: Cardiology

## 2014-04-22 NOTE — Telephone Encounter (Signed)
Spoke with pts Case Manager Nurse Renee to inform her that pt was contacted on 5/21 at 9:21 am by Dr Syliva OvermanSkians CMA in regards to her echo.  CMA Kenyatta contacted this pt and told her that echo was reassuring, and had normal EF.  I did not tell pts personal results with Case Manager Renee, but I did reassure her that pt was notified and if the results were critically abnormal, pt would have been contacted immediately, with intervention.  CM Renee verbalized understanding and agrees with respecting pts rights of privacy.

## 2014-04-22 NOTE — Telephone Encounter (Signed)
Follow up  ° ° ° °Returning call back to nurse  °

## 2014-04-22 NOTE — Telephone Encounter (Signed)
LMTCB

## 2014-04-22 NOTE — Telephone Encounter (Signed)
New message ° ° ° ° ° °Want echo results °

## 2014-12-14 ENCOUNTER — Encounter (HOSPITAL_COMMUNITY): Payer: Self-pay | Admitting: Emergency Medicine

## 2014-12-14 ENCOUNTER — Emergency Department (INDEPENDENT_AMBULATORY_CARE_PROVIDER_SITE_OTHER)
Admission: EM | Admit: 2014-12-14 | Discharge: 2014-12-14 | Disposition: A | Discharge status: Home / Self Care | Payer: Medicare Other | Source: Home / Self Care | Attending: Family Medicine | Admitting: Family Medicine

## 2014-12-14 ENCOUNTER — Emergency Department (INDEPENDENT_AMBULATORY_CARE_PROVIDER_SITE_OTHER): Payer: Medicare Other

## 2014-12-14 DIAGNOSIS — J9801 Acute bronchospasm: Secondary | ICD-10-CM | POA: Diagnosis not present

## 2014-12-14 DIAGNOSIS — R059 Cough, unspecified: Secondary | ICD-10-CM

## 2014-12-14 DIAGNOSIS — R0600 Dyspnea, unspecified: Secondary | ICD-10-CM

## 2014-12-14 DIAGNOSIS — R05 Cough: Secondary | ICD-10-CM

## 2014-12-14 DIAGNOSIS — J4540 Moderate persistent asthma, uncomplicated: Secondary | ICD-10-CM

## 2014-12-14 DIAGNOSIS — I517 Cardiomegaly: Secondary | ICD-10-CM

## 2014-12-14 MED ORDER — ALBUTEROL SULFATE (2.5 MG/3ML) 0.083% IN NEBU
INHALATION_SOLUTION | RESPIRATORY_TRACT | Status: AC
  Filled 2014-12-14: qty 3

## 2014-12-14 MED ORDER — ALBUTEROL SULFATE (2.5 MG/3ML) 0.083% IN NEBU
2.5000 mg | INHALATION_SOLUTION | Freq: Once | RESPIRATORY_TRACT | Status: AC
  Administered 2014-12-14: 2.5 mg via RESPIRATORY_TRACT

## 2014-12-14 MED ORDER — PREDNISONE 20 MG PO TABS: ORAL_TABLET | ORAL | Status: AC

## 2014-12-14 MED ORDER — ALBUTEROL SULFATE HFA 108 (90 BASE) MCG/ACT IN AERS: 2.0000 | INHALATION_SPRAY | RESPIRATORY_TRACT | Status: AC | PRN

## 2014-12-14 MED ORDER — IPRATROPIUM-ALBUTEROL 0.5-2.5 (3) MG/3ML IN SOLN
3.0000 mL | Freq: Once | RESPIRATORY_TRACT | Status: AC
  Administered 2014-12-14: 3 mL via RESPIRATORY_TRACT

## 2014-12-14 MED ORDER — IPRATROPIUM-ALBUTEROL 0.5-2.5 (3) MG/3ML IN SOLN
RESPIRATORY_TRACT | Status: AC
  Filled 2014-12-14: qty 3

## 2014-12-14 NOTE — Discharge Instructions (Signed)
Bronchospasm A bronchospasm is when the tubes that carry air in and out of your lungs (airways) spasm or tighten. During a bronchospasm it is hard to breathe. This is because the airways get smaller. A bronchospasm can be triggered by:  Allergies. These may be to animals, pollen, food, or mold.  Infection. This is a common cause of bronchospasm.  Exercise.  Irritants. These include pollution, cigarette smoke, strong odors, aerosol sprays, and paint fumes.  Weather changes.  Stress.  Being emotional. HOME CARE   Always have a plan for getting help. Know when to call your doctor and local emergency services (911 in the [place] Know where you can get emergency care.  Only take medicines as told by your doctor.  If you were prescribed an inhaler or nebulizer machine, ask your doctor how to use it correctly. Always use a spacer with your inhaler if you were given one.  Stay calm during an attack. Try to relax and breathe more slowly.  Control your home environment:  Change your heating and air conditioning filter at least once a month.  Limit your use of fireplaces and wood stoves.  Do not  smoke. Do not  allow smoking in your home.  Avoid perfumes and fragrances.  Get rid of pests (such as roaches and mice) and their droppings.  Throw away plants if you see mold on them.  Keep your house clean and dust free.  Replace carpet with wood, tile, or vinyl flooring. Carpet can trap dander and dust.  Use allergy-proof pillows, mattress covers, and box spring covers.  Wash bed sheets and blankets every week in hot water. Dry them in a dryer.  Use blankets that are made of polyester or cotton.  Wash hands frequently. GET HELP IF:  You have muscle aches.  You have chest pain.  The thick spit you spit or cough up (sputum) changes from clear or white to yellow, green, gray, or bloody.  The thick spit you spit or cough up gets thicker.  There are problems that may be related  to the medicine you are given such as:  A rash.  Itching.  Swelling.  Trouble breathing. GET HELP RIGHT AWAY IF:  You feel you cannot breathe or catch your breath.  You cannot stop coughing.  Your treatment is not helping you breathe better.  You have very bad chest pain. MAKE SURE YOU:   Understand these instructions.  Will watch your condition.  Will get help right away if you are not doing well or get worse. Document Released: 08/15/2009 Document Revised: 10/23/2013 Document Reviewed: 04/10/2013 [facility] Patient Information 2015 [place]. This information is not intended to replace advice given to you by your health care provider. Make sure you discuss any questions you have with your health care provider.  How to Use an Inhaler Using your inhaler correctly is very important. Good technique will make sure that the medicine reaches your lungs.  HOW TO USE AN INHALER:  Take the cap off the inhaler.  If this is the first time using your inhaler, you need to prime it. Shake the inhaler for 5 seconds. Release four puffs into the air, away from your face. Ask your doctor for help if you have questions.  Shake the inhaler for 5 seconds.  Turn the inhaler so the bottle is above the mouthpiece.  Put your pointer finger on top of the bottle. Your thumb holds the bottom of the inhaler.  Open your mouth.  Either hold  the inhaler away from your mouth (the width of 2 fingers) or place your lips tightly around the mouthpiece. Ask your doctor which way to use your inhaler.  Breathe out as much air as possible.  Breathe in and push down on the bottle 1 time to release the medicine. You will feel the medicine go in your mouth and throat.  Continue to take a deep breath in very slowly. Try to fill your lungs.  After you have breathed in completely, hold your breath for 10 seconds. This will help the medicine to settle in your lungs. If you cannot hold your breath for 10  seconds, hold it for as long as you can before you breathe out.  Breathe out slowly, through pursed lips. Whistling is an example of pursed lips.  If your doctor has told you to take more than 1 puff, wait at least 15-30 seconds between puffs. This will help you get the best results from your medicine. Do not use the inhaler more than your doctor tells you to.  Put the cap back on the inhaler.  Follow the directions from your doctor or from the inhaler package about cleaning the inhaler. If you use more than one inhaler, ask your doctor which inhalers to use and what order to use them in. Ask your doctor to help you figure out when you will need to refill your inhaler.  If you use a steroid inhaler, always rinse your mouth with water after your last puff, gargle and spit out the water. Do not swallow the water. GET HELP IF:  The inhaler medicine only partially helps to stop wheezing or shortness of breath.  You are having trouble using your inhaler.  You have some increase in thick spit (phlegm). GET HELP RIGHT AWAY IF:  The inhaler medicine does not help your wheezing or shortness of breath or you have tightness in your chest.  You have dizziness, headaches, or fast heart rate.  You have chills, fever, or night sweats.  You have a large increase of thick spit, or your thick spit is bloody. MAKE SURE YOU:   Understand these instructions.  Will watch your condition.  Will get help right away if you are not doing well or get worse. Document Released: 07/27/2008 Document Revised: 08/08/2013 Document Reviewed: 05/17/2013 [facility] Patient Information 2015 [place], [place]. This information is not intended to replace advice given to you by your health care provider. Make sure you discuss any questions you have with your health care provider.  Left Ventricular Dysfunction Left ventricular dysfunction means that the main pumping chamber of your heart (left ventricle) is not working well.  The heart is a muscle and needs a constant supply of oxygen and blood. If the arteries that deliver the oxygen and blood are blocked, parts of the heart may get weak and not pump well. The heart may also get weak from having to constantly pump against the resistance of high blood pressure or narrow arteries. CAUSES  Damage after a heart attack.  High blood pressure.  Leaking or malfunctioning heart valves.  Toxins such as alcohol.  Metabolic problems such as:  Vitamin deficiencies.  Thyroid problems.  Diabetes. Heartbeats that are irregular or too fast for a long time. SYMPTOMS Symptoms may progress to include:  Fatigue.  Reduced ability to exercise.  Trouble breathing, especially when active.  Shortness of breath at rest.  Trouble breathing when lying down at night.  Being more tired, light-headed, or confused.  Weight gain  with swelling of the legs and feet.  Passing out.  Sudden cardiac death. DIAGNOSIS  Your doctor may find that your heart pumping function is reduced after taking a medical history and doing a physical exam.   Clues from your exam may include:  The veins in your neck are too big.  An irregular heartbeat.  Abnormal heart sounds.  Sounds in your chest from fluid in your lungs.  Swelling in your ankles or feet.  Tender places in your belly plus swelling.  Other studies that may be done include:  Chest X-ray.  ECG (a recording of the electrical activity of your heart).  Echocardiogram (a picture of your heart made by using sound waves).  Holter monitoring (portable longer term checking of your heart rhythm).  Cardiac stress testing to see how well your heart is pumping.  Angiogram to look for any blocked arteries. HOME CARE INSTRUCTIONS  Activity level--- Your caregiver will help you determine what type of exercise program may be helpful. It is important to maintain strength and increase it if possible. Pace your activities and  avoid shortness of breath or chest pain. Plan activities for at least an hour after meals or before eating. This allows your body to handle one activity at a time. Your caregiver can help advise you.  Diet--- Maintain a low-salt and heart-healthy diet, or as directed by your caregiver. Get diet information from your caregiver or a registered dietitian. Keep your salt shaker out of sight and avoid adding salt to your foods. Measure the amounts of fluids you take in per day in cups and record these amounts.  Medications--- You may have been prescribed an ACE inhibitor or a beta-blocker to take for your heart failure. Take these medications as directed. They improve your heart function and your survival. Ask your caregiver if taking statins (drugs to lower cholesterol) would be helpful. Diuretics or water pills may be prescribed to help get rid of fluids. You may also be given a potassium supplement to replace the amount lost from the water pills.  Weight monitoring---Record your hospital or clinic weight. When you get home, compare it to your scale and record your weight. Weigh twice per day at first and record these weights. Try to weigh at the same time every day. It is best to weigh first thing in the morning, in your same clothes, after going to the bathroom and before eating or drinking anything. Place the scale on a hard-surfaced floor. Bring these weights to your caregiver to be reviewed during your appointments.  Blood pressure monitoring should be done twice a day at first, morning and evening, when you are relaxed. Once your blood pressure has stabilized, rechecking once a day and then a few times a week may be enough. You can get a home blood pressure cuff at your drugstore. Record these values and bring them with you for your clinic visits. Notify your caregiver if you become dizzy or lightheaded upon standing up.  Be familiar with your medications--- If you have trouble remembering when you took  them, write down times or set your medications out in advance for the day or the week to avoid problems. If you take your medications twice a day, place them by your toothbrush and get in the habit of brushing your teeth twice a day. If you are on a water pill (diuretic), take these in the morning so you are not up all night going to the bathroom.  If you are currently  a smoker, it is time to quit. Nicotine makes your heart work harder and is one of the leading causes of cardiac (heart) deaths. Do not leave your doctor's office without a smoking cessation plan or instructions on help available to quit smoking.  Immunization with influenza and pneumococcal vaccines may reduce the risk of respiratory infection.  Nonsteroidal anti-inflammatory drugs should not be used. They can cause salt (sodium) retention and also may impair the action of diuretics and ACE inhibitors. They can also elevate blood pressure. This includes drugs such as Advil, Motrin, ibuprofen, etc.  Aldosterone antagonists such as spironolactone may have helpful effects and may be used if there are symptoms at rest despite the use of diuretics, an ACE inhibitor, and (usually) a beta-blocker. Aldosterone antagonism is recommended in advanced heart failure in addition to ACE inhibition and diuretics to improve survival and decrease chances of other problems.  If you do not follow your diet and take your medications properly, this may lead to a rapid decline in your health, emergency care, or hospitalization. Follow the advice of your caregiver.  What to do if symptoms worsen--- If there are immediate problems go to the Emergency Department. This would include any symptoms which brought you in and which are getting worse rather than better. Call your local emergency services (911 in [place] for immediate care. DAILY PATH TO QUALITY LIVING  Monitor weight and record.  Monitor blood pressure and record.  Monitor fluid intake.  Monitor  sodium intake.  Monitor activity levels.  Take your medications.  Stop all use of nicotine.  Know when to call for help and do so. SEEK IMMEDIATE MEDICAL CARE IF:   Your weight increases by 03 lb/1.4 kg in 1 day or 05 lb/2.3 kg in a week, or as your caregiver suggests.  You notice increasing shortness of breath during rest, sleeping, or with activity, and which is unusual for you.  You develop an increase in angina, or develop chest pain which is unusual for you.  You develop sweating or nausea, which is unusual for you.  You notice swelling in your hands, feet, ankles, or abdomen.  You have a feeling of fullness in your abdomen or develop nausea or loss of appetite.  You notice lasting (persistent) dizziness, blurred vision, headache, or unsteadiness. Make an appointment with your caregiver as directed for follow-up. Document Released: 01/05/2005 Document Revised: 03/04/2014 Document Reviewed: 02/15/2008 [facility] Patient Information 2015 [place], [place]. This information is not intended to replace advice given to you by your health care provider. Make sure you discuss any questions you have with your health care provider.

## 2014-12-14 NOTE — ED Notes (Signed)
C/o  Productive cough.  Body ache.  Sob.   No fever, n/v/d.  No relief with otc meds.    Last week treated with zithromax and prednisone.

## 2014-12-14 NOTE — ED Provider Notes (Signed)
CSN: [id number]     Arrival date & time 12/14/14  1523 History   First MD Initiated Contact with Patient 12/14/14 1621     Chief Complaint  Patient presents with  . Cough   (Consider location/radiation/quality/duration/timing/severity/associated sxs/prior Treatment) HPI Comments: 81 year old morbidly obese female presents with the complaint of legs and arms hurting today and a cough and cold for 2 weeks. One week ago she saw her PCPs assistant and was treated with prednisone and Z-Pak and Tussionex. She states that although the Tussionex helped some her symptoms persist. She also complains of a copious amount of PND and nasal drainage. She is a nonsmoker and has never smoked. Denies any history of cardiopulmonary disease. It is noted on the chart that the patient stated she was on home oxygen as needed for suggesting there is a pulmonary condition requiring supplemental O2.   Past Medical History  Diagnosis Date  . High cholesterol   . Diabetes mellitus   . Arthritis    Past Surgical History  Procedure Laterality Date  . Total hip arthroplasty    . Total knee arthroplasty     History reviewed. No pertinent family history. History  Substance Use Topics  . Smoking status: Never Smoker   . Smokeless tobacco: Not on file  . Alcohol Use: No   OB History    No data available     Review of Systems  Constitutional: Negative for fever, chills, activity change, appetite change and fatigue.  HENT: Positive for congestion, postnasal drip and rhinorrhea. Negative for facial swelling and sore throat.   Eyes: Negative.   Respiratory: Positive for cough. Negative for shortness of breath.   Cardiovascular: Negative.   Musculoskeletal: Negative for neck pain and neck stiffness.  Neurological: Negative.     Allergies  Penicillins  Home Medications   Prior to Admission medications   Medication Sig Start Date End Date Taking? Authorizing Provider  Ascorbic Acid (VITAMIN C) 1000 MG tablet  Take 1,000 mg by mouth daily.   Yes Historical Provider, MD  aspirin 325 MG EC tablet Take 325 mg by mouth daily.     Yes Historical Provider, MD  Cyanocobalamin (VITAMIN B-12 PO) Take 1 tablet by mouth daily.     Yes Historical Provider, MD  pantoprazole (PROTONIX) 40 MG tablet Take 40 mg by mouth daily.     Yes Historical Provider, MD  simvastatin (ZOCOR) 40 MG tablet Take 40 mg by mouth daily.     Yes Historical Provider, MD  VITAMIN D, ERGOCALCIFEROL, PO Take 1 tablet by mouth daily.     Yes Historical Provider, MD  albuterol (PROVENTIL HFA;VENTOLIN HFA) 108 (90 BASE) MCG/ACT inhaler Inhale 2 puffs into the lungs every 4 (four) hours as needed for wheezing or shortness of breath. 12/14/14   Hayden Rasmussen, NP  gabapentin (NEURONTIN) 300 MG capsule Take 300 mg by mouth daily.      Historical Provider, MD  predniSONE (DELTASONE) 20 MG tablet Take 3 tabs po on first day, 2 tabs second day, 2 tabs third day, 1 tab fourth day, 1 tab 5th day. Take with food. 12/14/14   Hayden Rasmussen, NP  valsartan (DIOVAN) 320 MG tablet Take 320 mg by mouth daily.      Historical Provider, MD   BP 158/94 mmHg  Pulse 78  Temp(Src) 98 F (36.7 C) (Oral)  Resp 20  SpO2 94% Physical Exam  Constitutional: She is oriented to person, place, and time. She appears well-developed. No distress.  Obese  HENT:  Mouth/Throat: No oropharyngeal exudate.  Bilateral TMs are normal Oropharynx with erythema, cobblestoning and clear PND.  Eyes: Conjunctivae and EOM are normal.  Neck: Normal range of motion. Neck supple.  Cardiovascular: Normal rate, regular rhythm, normal heart sounds and intact distal pulses.   Pulmonary/Chest:  Few inspiratory wheezes. Expiration with diffuse bilateral coarseness, rhonchi and some wheeze.  Musculoskeletal: She exhibits edema.  Lymphadenopathy:    She has no cervical adenopathy.  Neurological: She is alert and oriented to person, place, and time.  Skin: Skin is warm and dry. There is pallor.   Psychiatric: She has a normal mood and affect.  Vitals reviewed.   ED Course  Procedures (including critical care time) Labs Review Labs Reviewed - No data to display  Imaging Review Dg Chest 2 View  12/14/2014   CLINICAL DATA:  81 year old female with cough for several weeks. Shortness of breath and wheezing. Initial encounter.  EXAM: CHEST  2 VIEW  COMPARISON:  05/25/2011 and earlier.  FINDINGS: Progressed cardiomegaly. Stable lung volumes and chronic coarse/interstitial opacity. No pneumothorax, pulmonary edema, pleural effusion or confluent pulmonary opacity. Calcified atherosclerosis of the aorta. Osteopenia.  IMPRESSION: Chronic lung disease and progressed cardiomegaly since 2012. No superimposed acute findings are identified.   Electronically Signed   By: Odessa FlemingH  Hall M.D.   On: 12/14/2014 16:56     MDM   1. RAD (reactive airway disease) with wheezing, moderate persistent, uncomplicated   2. Dyspnea   3. Bronchospasm   4. Cough   5. Cardiomegaly    Post DuoNeb patient states she is breathing better. There is much improvement and air movement and diminished adventitious sounds. She will be treated with albuterol HFA and low-dose tapering prednisone. She is aware this may increase her blood sugar temporarily. Follow-up with your PCP next week. For drainage Allegra or Claritin or Zyrtec. For stronger and sedating antihistamine Chlor-Trimeton 2 mg every 6 to 8 hours for drainage. Use this as last resort. Lots of fluids.    Hayden Rasmussenavid Taavi Hoose, NP 12/14/14 1721

## 2015-04-20 ENCOUNTER — Encounter (HOSPITAL_COMMUNITY): Payer: Self-pay | Admitting: Emergency Medicine

## 2015-04-20 ENCOUNTER — Emergency Department (INDEPENDENT_AMBULATORY_CARE_PROVIDER_SITE_OTHER)
Admission: EM | Admit: 2015-04-20 | Discharge: 2015-04-20 | Disposition: A | Discharge status: Home / Self Care | Payer: Medicare Other | Source: Home / Self Care | Attending: Family Medicine | Admitting: Family Medicine

## 2015-04-20 DIAGNOSIS — H9201 Otalgia, right ear: Secondary | ICD-10-CM | POA: Diagnosis not present

## 2015-04-20 DIAGNOSIS — H1013 Acute atopic conjunctivitis, bilateral: Secondary | ICD-10-CM

## 2015-04-20 DIAGNOSIS — R682 Dry mouth, unspecified: Secondary | ICD-10-CM | POA: Diagnosis not present

## 2015-04-20 MED ORDER — NYSTATIN 100000 UNIT/ML MT SUSP: 500000.0000 [IU] | Freq: Four times a day (QID) | OROMUCOSAL | Status: AC

## 2015-04-20 NOTE — ED Notes (Signed)
Reports "I hurt all over".   "My mouth is extremely dry".   Constipated.    Denies fever, n/v/d.   Symptoms present x 2 days.

## 2015-04-20 NOTE — ED Provider Notes (Signed)
[name] is a 81 y.o. female who presents to Urgent Care today for dry mouth eye irritation and ear pain.  1) dry mouth. Patient has chronic dry mouth and has had multiple treatments at her primary care provider's office. She cannot recall the name of any of the treatments. She has a dry mouth is persistent and quite bothersome. She states a gritty feeling in her mouth. She denies any significant pain fevers or chills.  2) eye irritation: Patient notes itchy scratchy watery eyes. This is been ongoing for several days. She's used some over-the-counter drops which helped a little. She cannot recall the name of the drop. No fevers or chills.  3) right-sided ear pain present for a few days. Mild to moderate. No treatments tried yet.  4) resolved body aches. Patient had body aches initially today which have since resolved.   Past Medical History  Diagnosis Date  . High cholesterol   . Diabetes mellitus   . Arthritis    Past Surgical History  Procedure Laterality Date  . Total hip arthroplasty    . Total knee arthroplasty     History  Substance Use Topics  . Smoking status: Never Smoker   . Smokeless tobacco: Not on file  . Alcohol Use: No   ROS as above Medications: No current facility-administered medications for this encounter.   Current Outpatient Prescriptions  Medication Sig Dispense Refill  . gabapentin (NEURONTIN) 300 MG capsule Take 300 mg by mouth daily.      . pantoprazole (PROTONIX) 40 MG tablet Take 40 mg by mouth daily.      . simvastatin (ZOCOR) 40 MG tablet Take 40 mg by mouth daily.      Marland Kitchen albuterol (PROVENTIL HFA;VENTOLIN HFA) 108 (90 BASE) MCG/ACT inhaler Inhale 2 puffs into the lungs every 4 (four) hours as needed for wheezing or shortness of breath. 1 Inhaler 0  . Ascorbic Acid (VITAMIN C) 1000 MG tablet Take 1,000 mg by mouth daily.    Marland Kitchen aspirin 325 MG EC tablet Take 325 mg by mouth daily.      . Cyanocobalamin (VITAMIN B-12 PO) Take 1 tablet by mouth  daily.      Marland Kitchen nystatin (MYCOSTATIN) 100000 UNIT/ML suspension Take 5 mLs (500,000 Units total) by mouth 4 (four) times daily. 60 mL 1  . predniSONE (DELTASONE) 20 MG tablet Take 3 tabs po on first day, 2 tabs second day, 2 tabs third day, 1 tab fourth day, 1 tab 5th day. Take with food. 9 tablet 0  . valsartan (DIOVAN) 320 MG tablet Take 320 mg by mouth daily.      Marland Kitchen VITAMIN D, ERGOCALCIFEROL, PO Take 1 tablet by mouth daily.       Allergies  Allergen Reactions  . Penicillins Swelling     Exam:  BP 157/97 mmHg  Pulse 69  Temp(Src) 98.2 F (36.8 C) (Oral)  Resp 18  SpO2 96% Gen: Well NAD morbidly obese elderly woman HEENT: EOMI,  MMM normal tympanic membranes bilaterally. Mild watery discharge from the eyes bilaterally. Mouth is generally normal-appearing with dentures. Lungs: Normal work of breathing. CTABL Heart: RRR no MRG Abd: NABS, Soft. Nondistended, Nontender Exts: Brisk capillary refill, warm and well perfused.   No results found for this or any previous visit (from the past 24 hour(s)). No results found.  Assessment and Plan: 81 y.o. female with  1) dry mouth: Chronic issue with unclear etiology. Trial of nystatin swish and swallow solution 2) eye irritation: Allergic  conjunctivitis versus dry eyes. Zaditor and Systane artificial tears.  3) otalgia. Unclear etiology. Recommend Tylenol  Follow-up with PCP  Discussed warning signs or symptoms. Please see discharge instructions. Patient expresses understanding.     Rodolph Bong, MD 04/20/15 [phone]

## 2015-04-20 NOTE — Discharge Instructions (Signed)
Thank you for coming in today. Use over-the-counter Zaditor eyedrops (Ketotifen) Use over-the-counter Zyrtec (cetirizine)  Use Systane artificial tears as needed  Use the nystatin swish and swallow for your mouth.  Take tylenol.  Return as needed.   Thrush, Adult  Ginette Pitman, also called oral candidiasis, is a fungal infection that develops in the mouth and throat and on the tongue. It causes white patches to form on the mouth and tongue. Ginette Pitman is most common in older adults, but it can occur at any age.  Many cases of thrush are mild, but this infection can also be more serious. Ginette Pitman can be a recurring problem for people who have chronic illnesses or who take medicines that limit the body's ability to fight infection. Because these people have difficulty fighting infections, the fungus that causes thrush can spread throughout the body. This can cause life-threatening blood or organ infections. CAUSES  Ginette Pitman is usually caused by a yeast called Candida albicans. This fungus is normally present in small amounts in the mouth and on other mucous membranes. It usually causes no harm. However, when conditions are present that allow the fungus to grow uncontrolled, it invades surrounding tissues and becomes an infection. Less often, other Candida species can also lead to thrush.  RISK FACTORS Ginette Pitman is more likely to develop in the following people:  People with an impaired ability to fight infection (weakened immune system).   Older adults.   People with HIV.   People with diabetes.   People with dry mouth (xerostomia).   Pregnant women.   People with poor dental care, especially those who have false teeth.   People who use antibiotic medicines.  SIGNS AND SYMPTOMS  Ginette Pitman can be a mild infection that causes no symptoms. If symptoms develop, they may include:   A burning feeling in the mouth and throat. This can occur at the start of a thrush infection.   White patches that  adhere to the mouth and tongue. The tissue around the patches may be red, raw, and painful. If rubbed (during tooth brushing, for example), the patches and the tissue of the mouth may bleed easily.   A bad taste in the mouth or difficulty tasting foods.   Cottony feeling in the mouth.   Pain during eating and swallowing. DIAGNOSIS  Your health care provider can usually diagnose thrush by looking in your mouth and asking you questions about your health.  TREATMENT  Medicines that help prevent the growth of fungi (antifungals) are the standard treatment for thrush. These medicines are either applied directly to the affected area (topical) or swallowed (oral). The treatment will depend on the severity of the condition.  Mild Thrush Mild cases of thrush may clear up with the use of an antifungal mouth rinse or lozenges. Treatment usually lasts about 14 days.  Moderate to Severe Thrush  More severe thrush infections that have spread to the esophagus are treated with an oral antifungal medicine. A topical antifungal medicine may also be used.   For some severe infections, a treatment period longer than 14 days may be needed.   Oral antifungal medicines are almost never used during pregnancy because the fetus may be harmed. However, if a pregnant woman has a rare, severe thrush infection that has spread to her blood, oral antifungal medicines may be used. In this case, the risk of harm to the mother and fetus from the severe thrush infection may be greater than the risk posed by the use of antifungal  medicines.  Persistent or Recurrent Thrush For cases of thrush that do not go away or keep coming back, treatment may involve the following:   Treatment may be needed twice as long as the symptoms last.   Treatment will include both oral and topical antifungal medicines.   People with weakened immune systems can take an antifungal medicine on a continuous basis to prevent thrush infections.   It is important to treat conditions that make you more likely to get thrush, such as diabetes or HIV.  HOME CARE INSTRUCTIONS   Only take over-the-counter or prescription medicine as directed by your health care provider. Talk to your health care provider about an over-the-counter medicine called gentian violet, which kills bacteria and fungi.   Eat plain, unflavored yogurt as directed by your health care provider. Check the label to make sure the yogurt contains live cultures. This yogurt can help healthy bacteria grow in the mouth that can stop the growth of the fungus that causes thrush.   Try these measures to help reduce the discomfort of thrush:   Drink cold liquids such as water or iced tea.   Try flavored ice treats or frozen juices.   Eat foods that are easy to swallow, such as gelatin, ice cream, or custard.   If the patches in your mouth are painful, try drinking from a straw.   Rinse your mouth several times a day with a warm saltwater rinse. You can make the saltwater mixture with 1 tsp (6 g) of salt in 8 fl oz (0.2 L) of warm water.   If you wear dentures, remove the dentures before going to bed, brush them vigorously, and soak them in a cleaning solution as directed by your health care provider.   Women who are breastfeeding should clean their nipples with an antifungal medicine as directed by their health care provider. Dry the nipples after breastfeeding. Applying lanolin-containing body lotion may help relieve nipple soreness.  SEEK MEDICAL CARE IF:  Your symptoms are getting worse or are not improving within 7 days of starting treatment.   You have symptoms of spreading infection, such as white patches on the skin outside of the mouth.   You are nursing and you have redness, burning, or pain in the nipples that is not relieved with treatment.  MAKE SURE YOU:  Understand these instructions.  Will watch your condition.  Will get help right away if  you are not doing well or get worse. Document Released: 07/13/2004 Document Revised: 08/08/2013 Document Reviewed: 05/21/2013 [facility] Patient Information 2015 [place], [place]. This information is not intended to replace advice given to you by your health care provider. Make sure you discuss any questions you have with your health care provider.    Allergic Conjunctivitis The conjunctiva is a thin membrane that covers the visible white part of the eyeball and the underside of the eyelids. This membrane protects and lubricates the eye. The membrane has small blood vessels running through it that can normally be seen. When the conjunctiva becomes inflamed, the condition is called conjunctivitis. In response to the inflammation, the conjunctival blood vessels become swollen. The swelling results in redness in the normally white part of the eye. The blood vessels of this membrane also react when a person has allergies and is then called allergic conjunctivitis. This condition usually lasts for as long as the allergy persists. Allergic conjunctivitis cannot be passed to another person (non-contagious). The likelihood of bacterial infection is great and the cause is  not likely due to allergies if the inflamed eye has:  A sticky discharge.  Discharge or sticking together of the lids in the morning.  Scaling or flaking of the eyelids where the eyelashes come out.  Red swollen eyelids. CAUSES   Viruses.  Irritants such as foreign bodies.  Chemicals.  General allergic reactions.  Inflammation or serious diseases in the inside or the outside of the eye or the orbit (the boney cavity in which the eye sits) can cause a "red eye." SYMPTOMS   Eye redness.  Tearing.  Itchy eyes.  Burning feeling in the eyes.  Clear drainage from the eye.  Allergic reaction due to pollens or ragweed sensitivity. Seasonal allergic conjunctivitis is frequent in the spring when pollens are in the air and in the  fall. DIAGNOSIS  This condition, in its many forms, is usually diagnosed based on the history and an ophthalmological exam. It usually involves both eyes. If your eyes react at the same time every year, allergies may be the cause. While most "red eyes" are due to allergy or an infection, the role of an eye (ophthalmological) exam is important. The exam can rule out serious diseases of the eye or orbit. TREATMENT   Non-antibiotic eye drops, ointments, or medications by mouth may be prescribed if the ophthalmologist is sure the conjunctivitis is due to allergies alone.  Over-the-counter drops and ointments for allergic symptoms should be used only after other causes of conjunctivitis have been ruled out, or as your caregiver suggests. Medications by mouth are often prescribed if other allergy-related symptoms are present. If the ophthalmologist is sure that the conjunctivitis is due to allergies alone, treatment is normally limited to drops or ointments to reduce itching and burning. HOME CARE INSTRUCTIONS   Wash hands before and after applying drops or ointments, or touching the inflamed eye(s) or eyelids.  Do not let the eye dropper tip or ointment tube touch the eyelid when putting medicine in your eye.  Stop using your soft contact lenses and throw them away. Use a new pair of lenses when recovery is complete. You should run through sterilizing cycles at least three times before use after complete recovery if the old soft contact lenses are to be used. Hard contact lenses should be stopped. They need to be thoroughly sterilized before use after recovery.  Itching and burning eyes due to allergies is often relieved by using a cool cloth applied to closed eye(s). SEEK MEDICAL CARE IF:   Your problems do not go away after two or three days of treatment.  Your lids are sticky (especially in the morning when you wake up) or stick together.  Discharge develops. Antibiotics may be needed either as  drops, ointment, or by mouth.  You have extreme light sensitivity.  An oral temperature above 102 F (38.9 C) develops.  Pain in or around the eye or any other visual symptom develops. MAKE SURE YOU:   Understand these instructions.  Will watch your condition.  Will get help right away if you are not doing well or get worse. Document Released: 01/08/2003 Document Revised: 01/10/2012 Document Reviewed: 12/04/2007 [facility] Patient Information 2015 [place], [place]. This information is not intended to replace advice given to you by your health care provider. Make sure you discuss any questions you have with your health care provider.

## 2015-08-19 IMAGING — DX DG CHEST 2V
3 series · 3 of 3 positions shown · non-contrast
Comparison: 05/25/2011 and earlier.

CLINICAL DATA: 87-year-old female with cough for several weeks.
Shortness of breath and wheezing. Initial encounter.

EXAM:
CHEST  2 VIEW

[chest ap]
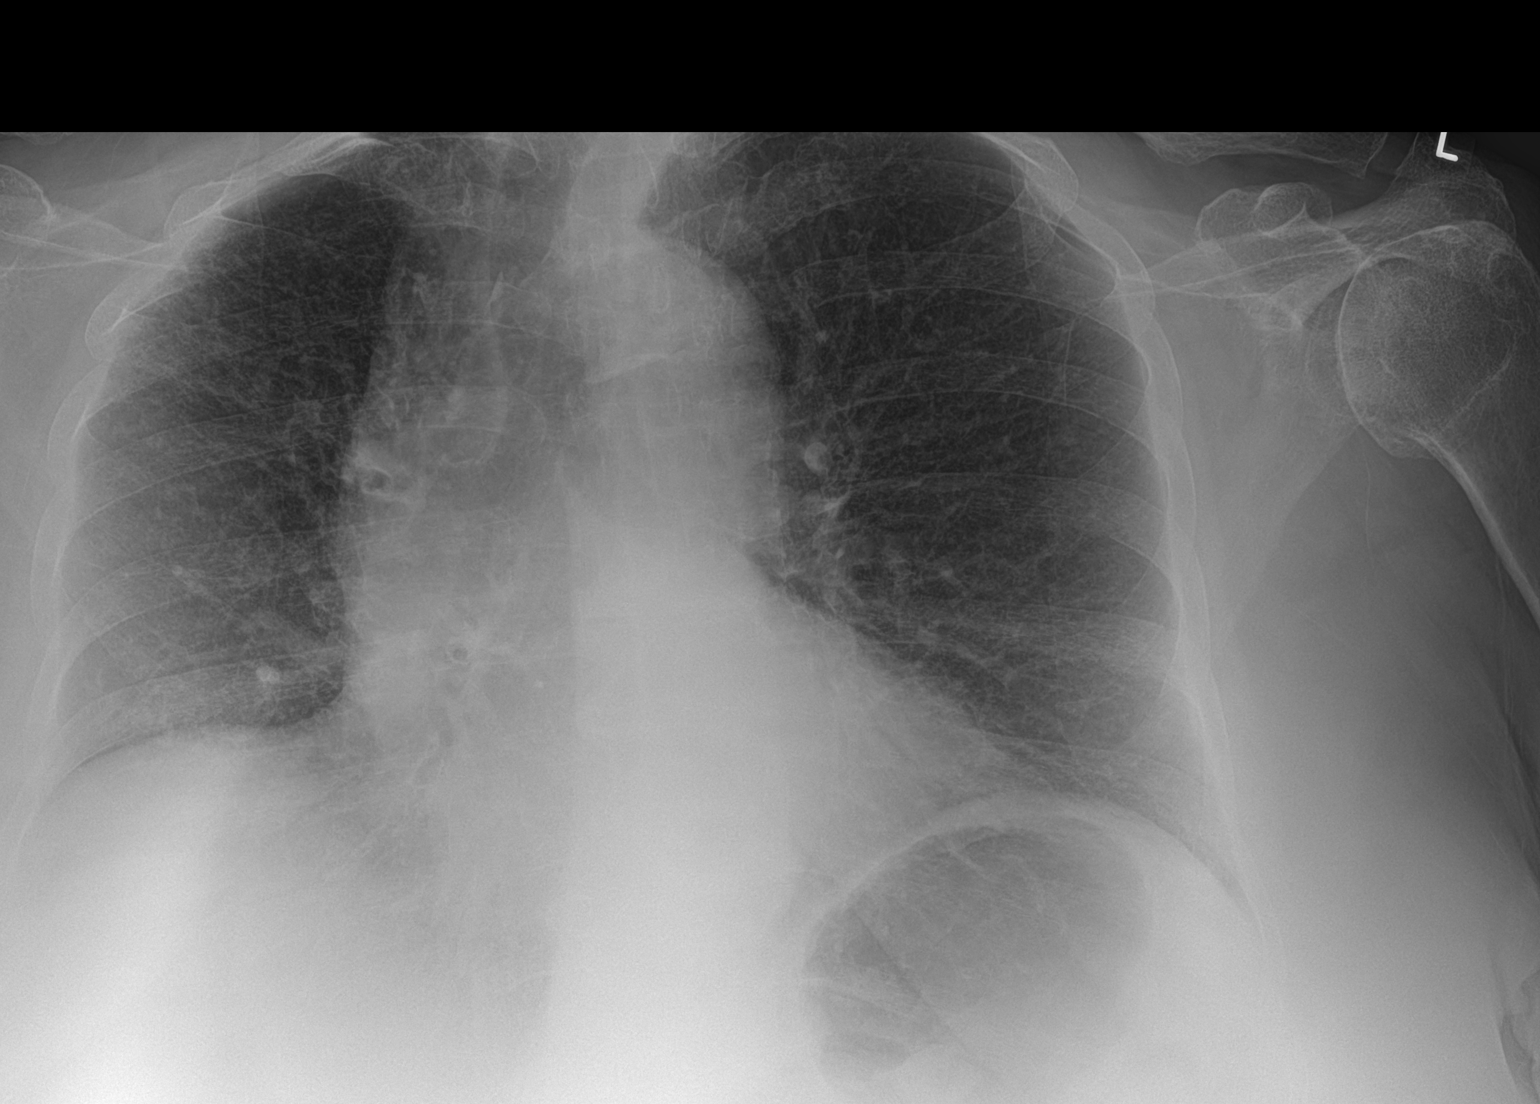

[chest lat (1 of 2)]
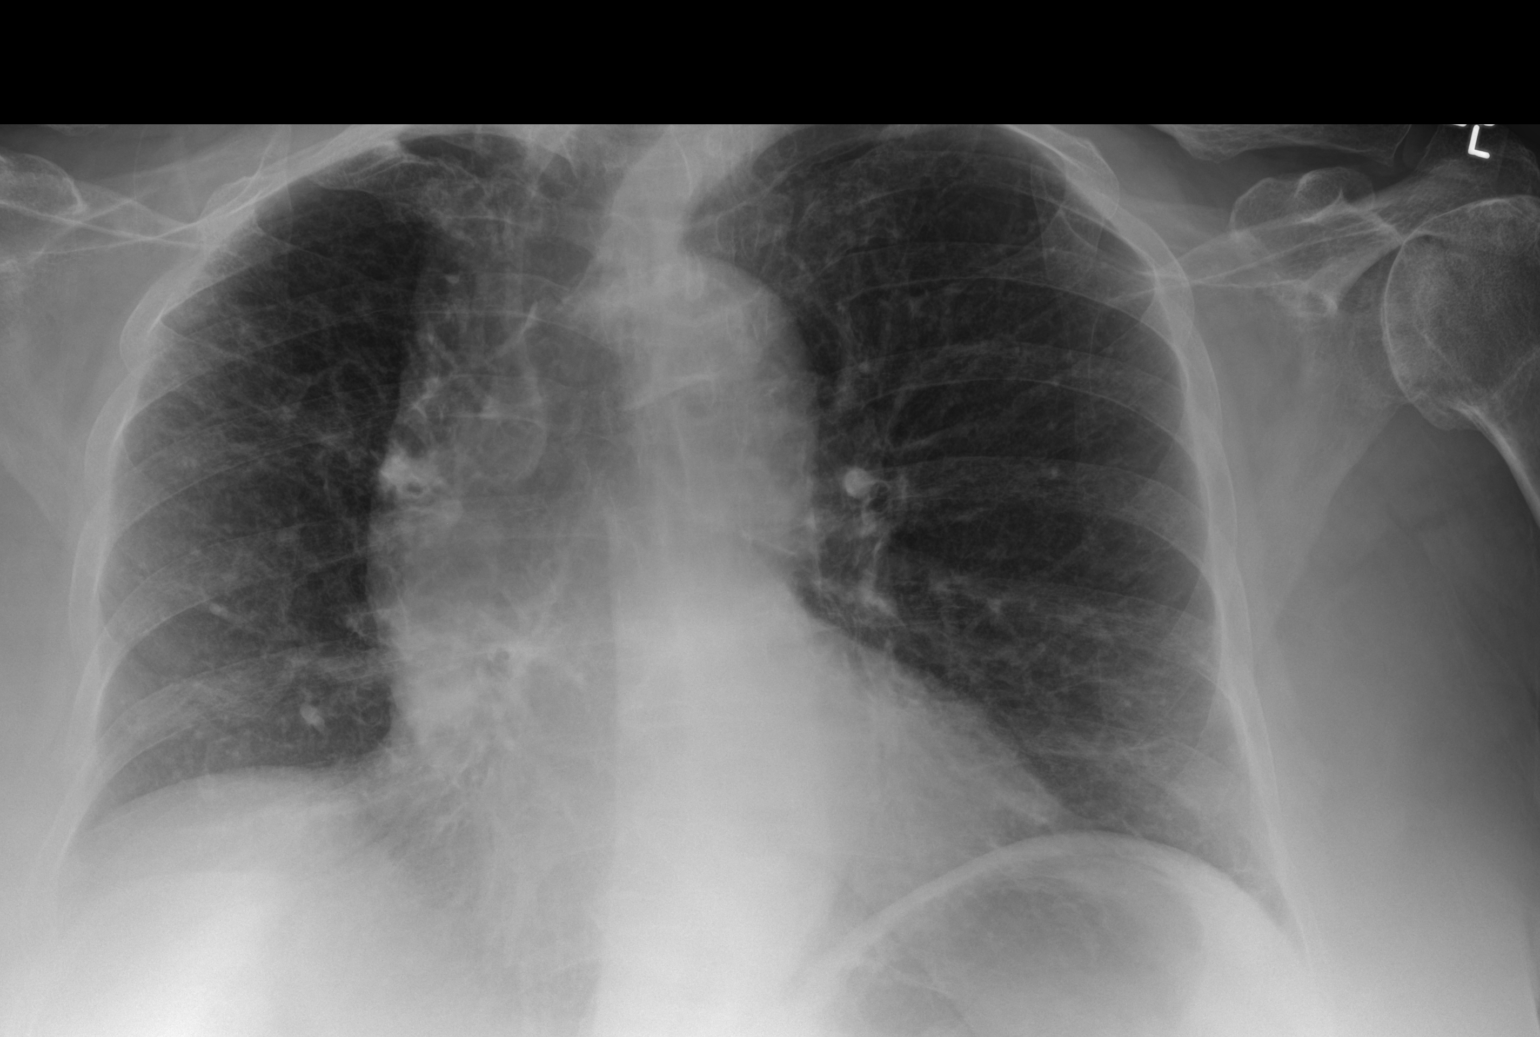

[chest lat (2 of 2)]
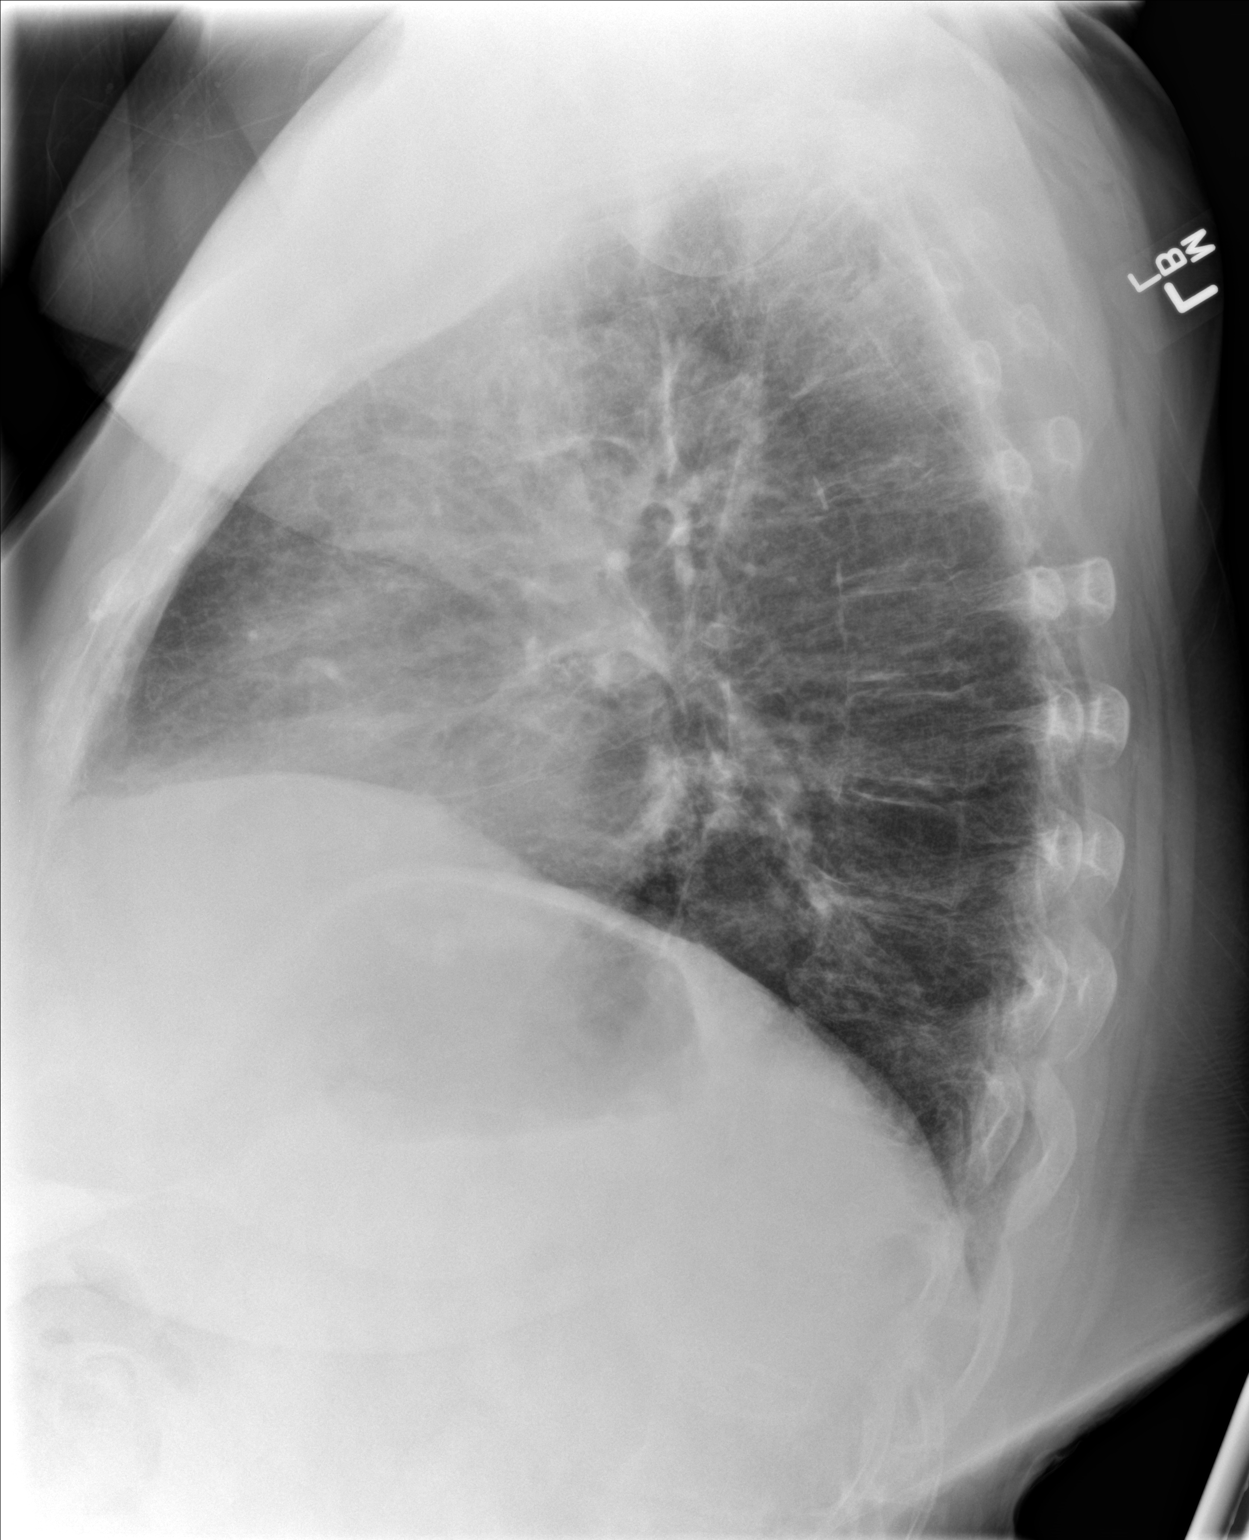

[3 of 3 positions shown; findings below may reference images not displayed]

FINDINGS: Progressed cardiomegaly. Stable lung volumes and chronic
coarse/interstitial opacity. No pneumothorax, pulmonary edema,
pleural effusion or confluent pulmonary opacity. Calcified
atherosclerosis of the aorta. Osteopenia.
IMPRESSION: Chronic lung disease and progressed cardiomegaly since 6506. No
superimposed acute findings are identified.

## 2016-06-01 DEATH — deceased
